# Patient Record
Sex: Male | Born: 1985 | Race: Black or African American | Hispanic: No | Marital: Single | Smoking: Never smoker
Health system: Southern US, Community
[De-identification: ages and names within clinical notes are randomized; demographics above are authoritative.]

---
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
## 2007-10-15 ENCOUNTER — Emergency Department: Payer: Self-pay | Admitting: Emergency Medicine

## 2007-12-15 ENCOUNTER — Emergency Department: Payer: Self-pay | Admitting: Emergency Medicine

## 2008-04-27 ENCOUNTER — Emergency Department: Payer: Self-pay | Admitting: Emergency Medicine

## 2009-05-11 ENCOUNTER — Emergency Department: Payer: Self-pay | Admitting: Emergency Medicine

## 2015-04-23 ENCOUNTER — Encounter: Payer: Self-pay | Admitting: Emergency Medicine

## 2015-04-23 ENCOUNTER — Emergency Department
Admission: EM | Admit: 2015-04-23 | Discharge: 2015-04-23 | Disposition: A | Discharge status: Home / Self Care | Payer: Medicaid Other | Attending: Emergency Medicine | Admitting: Emergency Medicine

## 2015-04-23 DIAGNOSIS — R197 Diarrhea, unspecified: Secondary | ICD-10-CM | POA: Insufficient documentation

## 2015-04-23 DIAGNOSIS — R112 Nausea with vomiting, unspecified: Secondary | ICD-10-CM | POA: Insufficient documentation

## 2015-04-23 DIAGNOSIS — R1110 Vomiting, unspecified: Secondary | ICD-10-CM | POA: Diagnosis present

## 2015-04-23 LAB — URINALYSIS COMPLETE WITH MICROSCOPIC (ARMC ONLY)
BILIRUBIN URINE: NEGATIVE
Bacteria, UA: NONE SEEN
GLUCOSE, UA: NEGATIVE mg/dL
HGB URINE DIPSTICK: NEGATIVE
Ketones, ur: NEGATIVE mg/dL
Leukocytes, UA: NEGATIVE
Nitrite: NEGATIVE
PH: 5 (ref 5.0–8.0)
Protein, ur: 30 mg/dL — AB
Specific Gravity, Urine: 1.028 (ref 1.005–1.030)

## 2015-04-23 LAB — COMPREHENSIVE METABOLIC PANEL
ALT: 58 U/L (ref 17–63)
ANION GAP: 8 (ref 5–15)
AST: 40 U/L (ref 15–41)
Albumin: 5.3 g/dL — ABNORMAL HIGH (ref 3.5–5.0)
Alkaline Phosphatase: 44 U/L (ref 38–126)
BUN: 21 mg/dL — ABNORMAL HIGH (ref 6–20)
CALCIUM: 10 mg/dL (ref 8.9–10.3)
CHLORIDE: 105 mmol/L (ref 101–111)
CO2: 25 mmol/L (ref 22–32)
Creatinine, Ser: 1.21 mg/dL (ref 0.61–1.24)
GFR calc non Af Amer: >60 mL/min (ref >60)
Glucose, Bld: 105 mg/dL — ABNORMAL HIGH (ref 65–99)
Potassium: 4.2 mmol/L (ref 3.5–5.1)
SODIUM: 138 mmol/L (ref 135–145)
Total Bilirubin: 1 mg/dL (ref 0.3–1.2)
Total Protein: 9.3 g/dL — ABNORMAL HIGH (ref 6.5–8.1)

## 2015-04-23 LAB — CBC
HCT: 44.2 % (ref 40.0–52.0)
Hemoglobin: 15.2 g/dL (ref 13.0–18.0)
MCH: 28.7 pg (ref 26.0–34.0)
MCHC: 34.4 g/dL (ref 32.0–36.0)
MCV: 83.3 fL (ref 80.0–100.0)
PLATELETS: 281 10*3/uL (ref 150–440)
RBC: 5.31 MIL/uL (ref 4.40–5.90)
RDW: 14.7 % — ABNORMAL HIGH (ref 11.5–14.5)
WBC: 10.4 10*3/uL (ref 3.8–10.6)

## 2015-04-23 LAB — LIPASE, BLOOD: LIPASE: 23 U/L (ref 11–51)

## 2015-04-23 MED ORDER — EPINEPHRINE 0.3 MG/0.3ML IJ SOAJ: 0.3000 mg | Freq: Once | INTRAMUSCULAR | Status: AC

## 2015-04-23 MED ORDER — ONDANSETRON HCL 4 MG PO TABS: 4.0000 mg | ORAL_TABLET | Freq: Three times a day (TID) | ORAL | Status: AC | PRN

## 2015-04-23 NOTE — Discharge Instructions (Signed)
Please seek medical attention for any high fevers, chest pain, shortness of breath, change in behavior, persistent vomiting, bloody stool or any other new or concerning symptoms. ° ° °Food Choices to Help Relieve Diarrhea, Adult °When you have diarrhea, the foods you eat and your eating habits are very important. Choosing the right foods and drinks can help relieve diarrhea. Also, because diarrhea can last up to 7 days, you need to replace lost fluids and electrolytes (such as sodium, potassium, and chloride) in order to help prevent dehydration.  °WHAT GENERAL GUIDELINES DO I NEED TO FOLLOW? °· Slowly drink 1 cup (8 oz) of fluid for each episode of diarrhea. If you are getting enough fluid, your urine will be clear or pale yellow. °· Eat starchy foods. Some good choices include white rice, white toast, pasta, low-fiber cereal, baked potatoes (without the skin), saltine crackers, and bagels. °· Avoid large servings of any cooked vegetables. °· Limit fruit to two servings per day. A serving is ½ cup or 1 small piece. °· Choose foods with less than 2 g of fiber per serving. °· Limit fats to less than 8 tsp (38 g) per day. °· Avoid fried foods. °· Eat foods that have probiotics in them. Probiotics can be found in certain dairy products. °· Avoid foods and beverages that may increase the speed at which food moves through the stomach and intestines (gastrointestinal tract). Things to avoid include: °¨ High-fiber foods, such as dried fruit, raw fruits and vegetables, nuts, seeds, and whole grain foods. °¨ Spicy foods and high-fat foods. °¨ Foods and beverages sweetened with high-fructose corn syrup, honey, or sugar alcohols such as xylitol, sorbitol, and mannitol. °WHAT FOODS ARE RECOMMENDED? °Grains °White rice. White, French, or pita breads (fresh or toasted), including plain rolls, buns, or bagels. White pasta. Saltine, soda, or graham crackers. Pretzels. Low-fiber cereal. Cooked cereals made with water (such as  cornmeal, farina, or cream cereals). Plain muffins. Matzo. Melba toast. Zwieback.  °Vegetables °Potatoes (without the skin). Strained tomato and vegetable juices. Most well-cooked and canned vegetables without seeds. Tender lettuce. °Fruits °Cooked or canned applesauce, apricots, cherries, fruit cocktail, grapefruit, peaches, pears, or plums. Fresh bananas, apples without skin, cherries, grapes, cantaloupe, grapefruit, peaches, oranges, or plums.  °Meat and Other Protein Products °Baked or boiled chicken. Eggs. Tofu. Fish. Seafood. Smooth peanut butter. Ground or well-cooked tender beef, ham, veal, lamb, pork, or poultry.  °Dairy °Plain yogurt, kefir, and unsweetened liquid yogurt. Lactose-free milk, buttermilk, or soy milk. Plain hard cheese. °Beverages °Sport drinks. Clear broths. Diluted fruit juices (except prune). Regular, caffeine-free sodas such as ginger ale. Water. Decaffeinated teas. Oral rehydration solutions. Sugar-free beverages not sweetened with sugar alcohols. °Other °Bouillon, broth, or soups made from recommended foods.  °The items listed above may not be a complete list of recommended foods or beverages. Contact your dietitian for more options. °WHAT FOODS ARE NOT RECOMMENDED? °Grains °Whole grain, whole wheat, bran, or rye breads, rolls, pastas, crackers, and cereals. Wild or brown rice. Cereals that contain more than 2 g of fiber per serving. Corn tortillas or taco shells. Cooked or dry oatmeal. Granola. Popcorn. °Vegetables °Raw vegetables. Cabbage, broccoli, Brussels sprouts, artichokes, baked beans, beet greens, corn, kale, legumes, peas, sweet potatoes, and yams. Potato skins. Cooked spinach and cabbage. °Fruits °Dried fruit, including raisins and dates. Raw fruits. Stewed or dried prunes. Fresh apples with skin, apricots, mangoes, pears, raspberries, and strawberries.  °Meat and Other Protein Products °Chunky peanut butter. Nuts and seeds. Beans and lentils.   Bacon.  °Dairy °High-fat  cheeses. Milk, chocolate milk, and beverages made with milk, such as milk shakes. Cream. Ice cream. °Sweets and Desserts °Sweet rolls, doughnuts, and sweet breads. Pancakes and waffles. °Fats and Oils °Butter. Cream sauces. Margarine. Salad oils. Plain salad dressings. Olives. Avocados.  °Beverages °Caffeinated beverages (such as coffee, tea, soda, or energy drinks). Alcoholic beverages. Fruit juices with pulp. Prune juice. Soft drinks sweetened with high-fructose corn syrup or sugar alcohols. °Other °Coconut. Hot sauce. Chili powder. Mayonnaise. Gravy. Cream-based or milk-based soups.  °The items listed above may not be a complete list of foods and beverages to avoid. Contact your dietitian for more information. °WHAT SHOULD I DO IF I BECOME DEHYDRATED? °Diarrhea can sometimes lead to dehydration. Signs of dehydration include dark urine and dry mouth and skin. If you think you are dehydrated, you should rehydrate with an oral rehydration solution. These solutions can be purchased at pharmacies, retail stores, or online.  °Drink ½-1 cup (120-240 mL) of oral rehydration solution each time you have an episode of diarrhea. If drinking this amount makes your diarrhea worse, try drinking smaller amounts more often. For example, drink 1-3 tsp (5-15 mL) every 5-10 minutes.  °A general rule for staying hydrated is to drink 1½-2 L of fluid per day. Talk to your health care provider about the specific amount you should be drinking each day. Drink enough fluids to keep your urine clear or pale yellow. °  °This information is not intended to replace advice given to you by your health care provider. Make sure you discuss any questions you have with your health care provider. °  °Document Released: 04/16/2003 Document Revised: 02/14/2014 Document Reviewed: 12/17/2012 °Elsevier Interactive Patient Education ©2016 [employer] ° °

## 2015-04-23 NOTE — ED Notes (Signed)
Pt informed to return if any life threatening symptoms occur.  

## 2015-04-23 NOTE — ED Provider Notes (Signed)
[facility] Emergency Department Provider Note    ____________________________________________  Time seen: ~1420  I have reviewed the triage vital signs and the nursing notes.   HISTORY  Chief Complaint Emesis and Diarrhea   History limited by: Not Limited   HPI Dalton Marks is a 30 y.o. male who presents to the emergency department because of concerns for nausea and vomiting and diarrhea. He states the symptoms started roughly 12 hours ago. He has only had one episode of vomiting although states he does not have an appetite and does not feel like he could eat or drink. He has some multiple episodes of watery diarrhea. He denies any blood in the diarrhea hours episode of emesis. He denies any associated abdominal pain. Denies any associated fevers. He does not believe he is been around anybody who has been sick however his companion states that she has also started to throw up. He denies any associated fevers.  In addition the patient asked to get a EpiPen prescription. 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   History reviewed. No pertinent past medical history.  There are no active problems to display for this patient.   History reviewed. No pertinent past surgical history.  No current outpatient prescriptions on file.  Allergies Wasp venom  No family history on file.  Social History Social History  Substance Use Topics  . Smoking status: Never Smoker   . Smokeless tobacco: Never Used  . Alcohol Use: No    Review of Systems  Constitutional: Negative for fever. Cardiovascular: Negative for chest pain. Respiratory: Negative for shortness of breath. Gastrointestinal: Positive for nausea and vomiting. Positive for diarrhea.  Neurological: Negative for headaches, focal weakness or numbness.  10-point ROS otherwise  negative.  ____________________________________________   PHYSICAL EXAM:  VITAL SIGNS: ED Triage Vitals  Enc Vitals Group     BP 04/23/15 1147 126/76 mmHg     Pulse Rate 04/23/15 1147 89     Resp 04/23/15 1147 18     Temp 04/23/15 1154 97.8 F (36.6 C)     Temp Source 04/23/15 1147 Oral     SpO2 04/23/15 1147 98 %     Weight 04/23/15 1147 212 lb (96.163 kg)     Height 04/23/15 1147 5\' 10"  (1.778 m)     Head Cir --      Peak Flow --      Pain Score 04/23/15 1149 0   Constitutional: Alert and oriented. Well appearing and in no distress. Eyes: Conjunctivae are normal. PERRL. Normal extraocular movements. ENT   Head: Normocephalic and atraumatic.   Nose: No congestion/rhinnorhea.   Mouth/Throat: Mucous membranes are moist.   Neck: No stridor. Hematological/Lymphatic/Immunilogical: No cervical lymphadenopathy. Cardiovascular: Normal rate, regular rhythm.  No murmurs, rubs, or gallops. Respiratory: Normal respiratory effort without tachypnea nor retractions. Breath sounds are clear and equal bilaterally. No wheezes/rales/rhonchi. Gastrointestinal: Soft and nontender. No distention.  Genitourinary: Deferred Musculoskeletal: Normal range of motion in all extremities. No joint effusions.  No lower extremity tenderness nor edema. Neurologic:  Normal speech and language. No gross focal neurologic deficits are appreciated.  Skin:  Skin is warm, dry and intact. No rash noted. Psychiatric: Mood and affect are normal. Speech and behavior are normal. Patient exhibits appropriate insight and judgment.  ____________________________________________    LABS (pertinent positives/negatives)  Labs Reviewed  COMPREHENSIVE METABOLIC PANEL - Abnormal; Notable for the following:    Glucose,  Bld 105 (*)    BUN 21 (*)    Total Protein 9.3 (*)    Albumin 5.3 (*)    All other components within normal limits  CBC - Abnormal; Notable for the following:    RDW 14.7 (*)    All other  components within normal limits  URINALYSIS COMPLETEWITH MICROSCOPIC ([facility] ONLY) - Abnormal; Notable for the following:    Color, Urine YELLOW (*)    APPearance CLEAR (*)    Protein, ur 30 (*)    Squamous Epithelial / LPF 0-5 (*)    All other components within normal limits  LIPASE, BLOOD     ____________________________________________   EKG  None  ____________________________________________    RADIOLOGY  None  ____________________________________________   PROCEDURES  Procedure(s) performed: None  Critical Care performed: No  ____________________________________________   INITIAL IMPRESSION / ASSESSMENT AND PLAN / ED COURSE  Pertinent labs & imaging results that were available during my care of the patient were reviewed by me and considered in my medical decision making (see chart for details).  Patient presented to the emergency department because of concerns for an nausea vomiting and diarrhea. Physical exam patient's abdomen is quite benign. He is not tachycardic nor hypotensive. At this point given lack of fever or leukocytosis I doubt a significant intra-abdominal infection. I think more likely patient suffering from gastroenteritis. Will give antiemetics.f  ____________________________________________   FINAL CLINICAL IMPRESSION(S) / ED DIAGNOSES  Final diagnoses:  Nausea and vomiting, vomiting of unspecified type  Diarrhea, unspecified type     Phineas Semen, MD 04/23/15 1431

## 2015-04-23 NOTE — ED Notes (Addendum)
Pt awakened at 3AM with n/v/d. Has vomited once, states 30 x clear diarrhea. Pt weak and tired, c/o headache 3/10. NAD noted.

## 2015-04-23 NOTE — ED Notes (Signed)
C/O vomiting, diarrhea, dizziness.  Onset of symptoms this morning at 0330.

## 2016-01-11 ENCOUNTER — Emergency Department: Payer: Medicaid Other

## 2016-01-11 ENCOUNTER — Emergency Department
Admission: EM | Admit: 2016-01-11 | Discharge: 2016-01-11 | Disposition: A | Discharge status: Home / Self Care | Payer: Medicaid Other | Attending: Emergency Medicine | Admitting: Emergency Medicine

## 2016-01-11 ENCOUNTER — Encounter: Payer: Self-pay | Admitting: Medical Oncology

## 2016-01-11 DIAGNOSIS — Y999 Unspecified external cause status: Secondary | ICD-10-CM | POA: Insufficient documentation

## 2016-01-11 DIAGNOSIS — W108XXA Fall (on) (from) other stairs and steps, initial encounter: Secondary | ICD-10-CM | POA: Diagnosis not present

## 2016-01-11 DIAGNOSIS — Y939 Activity, unspecified: Secondary | ICD-10-CM | POA: Insufficient documentation

## 2016-01-11 DIAGNOSIS — Y929 Unspecified place or not applicable: Secondary | ICD-10-CM | POA: Insufficient documentation

## 2016-01-11 DIAGNOSIS — S61411A Laceration without foreign body of right hand, initial encounter: Secondary | ICD-10-CM

## 2016-01-11 DIAGNOSIS — S60221A Contusion of right hand, initial encounter: Secondary | ICD-10-CM

## 2016-01-11 MED ORDER — LIDOCAINE HCL (PF) 1 % IJ SOLN
5.0000 mL | Freq: Once | INTRAMUSCULAR | Status: AC
  Administered 2016-01-11: 5 mL
  Filled 2016-01-11: qty 5

## 2016-01-11 NOTE — ED Provider Notes (Signed)
[facility] Emergency Department Provider Note ____________________________________________  Time seen: 911938  I have reviewed the triage vital signs and the nursing notes.  HISTORY  Chief Complaint  Laceration  HPI Dalton Marks is a 30 y.o. male who presents to the ED for right hand puncture laceration wound. He is right-hand dominant male who describes falling down the steps today and accidentally hitting an exposed screw anchoring the wall, with his right hand. The injury occurred over the knuckle of his right pinky. He denies any other injury at this time. He describes pain with composite fist and a small laceration over the knuckle. He reports his tetanus status is current as of 2 years prior.  History reviewed. No pertinent past medical history.  There are no active problems to display for this patient.  History reviewed. No pertinent surgical history.  Prior to Admission medications   Medication Sig Start Date End Date Taking? Authorizing Provider  EPINEPHrine (EPIPEN 2-PAK) 0.3 mg/0.3 mL IJ SOAJ injection Inject 0.3 mLs (0.3 mg total) into the muscle once. 04/23/15   Phineas SemenGraydon Goodman, MD  ondansetron (ZOFRAN) 4 MG tablet Take 1 tablet (4 mg total) by mouth every 8 (eight) hours as needed for nausea or vomiting. 04/23/15   Phineas SemenGraydon Goodman, MD    Allergies Wasp venom  No family history on file.  Social History Social History  Substance Use Topics  . Smoking status: Never Smoker  . Smokeless tobacco: Never Used  . Alcohol use No    Review of Systems  Constitutional: Negative for fever. Cardiovascular: Negative for chest pain. Respiratory: Negative for shortness of breath. Musculoskeletal: Negative for back pain. Right hand pain as above. Skin: Negative for rash. Neurological: Negative for headaches, focal weakness or numbness. ____________________________________________  PHYSICAL EXAM:  VITAL SIGNS: ED Triage Vitals [01/11/16 1825]   Enc Vitals Group     BP (!) 147/85     Pulse Rate 67     Resp 18     Temp 98.2 F (36.8 C)     Temp Source Oral     SpO2 98 %     Weight 225 lb (102.1 kg)     Height 5\' 10"  (1.778 m)     Head Circumference      Peak Flow      Pain Score 2     Pain Loc      Pain Edu?      Excl. in GC?    Constitutional: Alert and oriented. Well appearing and in no distress. Head: Normocephalic and atraumatic. Cardiovascular: Normal rate, regular rhythm. Normal distal pulses. Respiratory: Normal respiratory effort. No wheezes/rales/rhonchi. Musculoskeletal: Right hand with subtle swelling over the fifth MCP dorsally. Patient also has a small 0.5; laceration knuckle. He has decreased composite fist secondary to pain and disability. Nontender with normal range of motion in all other  extremities.  Neurologic:  Normal gross sensation. Normal speech and language. No gross focal neurologic deficits are appreciated. Skin:  Skin is warm, dry and intact. No rash noted.  ____________________________________________   RADIOLOGY  Right Hand IMPRESSION: No acute abnormality.  I, Marleigh Kaylor, Charlesetta IvoryJenise V Bacon, personally viewed and evaluated these images (plain radiographs) as part of my medical decision making, as well as reviewing the written report by the radiologist. ____________________________________________  PROCEDURES  LACERATION REPAIR Performed by: Lissa HoardMenshew, Sonnia Strong V Bacon Authorized by: Lissa HoardMenshew, Anand Tejada V Bacon Consent: Verbal consent obtained. Risks and benefits: risks, benefits and alternatives were discussed Consent given by: patient Patient identity  confirmed: provided demographic data Prepped and Draped in normal sterile fashion Wound explored  Laceration Location: right dorsal 5th MCP  Laceration Length: 0.5 cm  No Foreign Bodies seen or palpated  Anesthesia: local infiltration  Local anesthetic: lidocaine 1% w/o epinephrine  Anesthetic total: 2 ml  Irrigation method:  syringe Amount of cleaning: standard  Skin closure: 4-0 nylon  Number of sutures: 2   Technique: interrupted  Patient tolerance: Patient tolerated the procedure well with no immediate complications. ____________________________________________  INITIAL IMPRESSION / ASSESSMENT AND PLAN / ED COURSE  Patient with a right hand contusion and small laceration over the fifth MCP status post suture repair. Patient is discharged with instructions to keep the wound clean, covered, and dry. He should apply ice for the swelling of the knuckle. [facility] 10 [facility] for suture removal and may see Dr. Martha ClanKrasinski as needed for ongoing joint pain.  Clinical Course    ____________________________________________  FINAL CLINICAL IMPRESSION(S) / ED DIAGNOSES  Final diagnoses:  Laceration of right hand without foreign body, initial encounter  Contusion of right hand, initial encounter      Lissa HoardJenise V Bacon Kortlynn Poust, PA-C 01/11/16 2041    Phineas SemenGraydon Goodman, MD 01/11/16 2336

## 2016-01-11 NOTE — Discharge Instructions (Signed)
Your x-ray was negative today following your injury. Keep the wound clean, dry, and covered. Wash daily with soap & water. Follow-up with your provider or a local urgent care center for suture removal in 7-10 days. Apply ice to reduce swelling. Follow-up with Dr. Martha ClanKrasinski for continued symptoms.

## 2016-01-11 NOTE — ED Triage Notes (Signed)
Pt reports laceration to rt hand.

## 2017-12-28 ENCOUNTER — Emergency Department: Payer: Self-pay

## 2017-12-28 ENCOUNTER — Emergency Department
Admission: EM | Admit: 2017-12-28 | Discharge: 2017-12-28 | Disposition: A | Discharge status: Home / Self Care | Payer: Self-pay | Attending: Emergency Medicine | Admitting: Emergency Medicine

## 2017-12-28 ENCOUNTER — Encounter: Payer: Self-pay | Admitting: Emergency Medicine

## 2017-12-28 DIAGNOSIS — Y939 Activity, unspecified: Secondary | ICD-10-CM | POA: Insufficient documentation

## 2017-12-28 DIAGNOSIS — Y999 Unspecified external cause status: Secondary | ICD-10-CM | POA: Insufficient documentation

## 2017-12-28 DIAGNOSIS — Y929 Unspecified place or not applicable: Secondary | ICD-10-CM | POA: Insufficient documentation

## 2017-12-28 DIAGNOSIS — X509XXA Other and unspecified overexertion or strenuous movements or postures, initial encounter: Secondary | ICD-10-CM | POA: Insufficient documentation

## 2017-12-28 DIAGNOSIS — S63637A Sprain of interphalangeal joint of left little finger, initial encounter: Secondary | ICD-10-CM | POA: Insufficient documentation

## 2017-12-28 MED ORDER — IBUPROFEN 800 MG PO TABS: 800.0000 mg | ORAL_TABLET | Freq: Three times a day (TID) | ORAL | 0 refills | Status: AC | PRN

## 2017-12-28 NOTE — ED Provider Notes (Signed)
[facility] Emergency Department Provider Note   ____________________________________________   First MD Initiated Contact with Patient 12/28/17 [phone]     (approximate)  I have reviewed the triage vital signs and the nursing notes.   HISTORY  Chief Complaint Hand Pain    HPI Dalton Marks is a 32 y.o. male patient still working yesterday a.m. with left hand edema and swollen concentrated mostly in the fifth digit.  Patient decreased range of motion for extension.  Patient denies provoking incident for complaint.  Patient rates pain a 6/10.  Patient described the pain as "aching".  No palliative measures for complaint.  History reviewed. No pertinent past medical history.  There are no active problems to display for this patient.   History reviewed. No pertinent surgical history.  Prior to Admission medications   Medication Sig Start Date End Date Taking? Authorizing Provider  EPINEPHrine (EPIPEN 2-PAK) 0.3 mg/0.3 mL IJ SOAJ injection Inject 0.3 mLs (0.3 mg total) into the muscle once. 04/23/15   Phineas Semen, MD  ibuprofen (ADVIL,MOTRIN) 800 MG tablet Take 1 tablet (800 mg total) by mouth every 8 (eight) hours as needed for moderate pain. 12/28/17   Joni Reining, PA-C  ondansetron (ZOFRAN) 4 MG tablet Take 1 tablet (4 mg total) by mouth every 8 (eight) hours as needed for nausea or vomiting. 04/23/15   Phineas Semen, MD    Allergies Oxycodone and Wasp venom  No family history on file.  Social History Social History   Tobacco Use  . Smoking status: Never Smoker  . Smokeless tobacco: Never Used  Substance Use Topics  . Alcohol use: No  . Drug use: No    Review of Systems Constitutional: No fever/chills Eyes: No visual changes. ENT: No sore throat. Cardiovascular: Denies chest pain. Respiratory: Denies shortness of breath. Gastrointestinal: No abdominal pain.  No nausea, no vomiting.  No diarrhea.  No  constipation. Genitourinary: Negative for dysuria. Musculoskeletal: Left fifth finger pain. Skin: Negative for rash. Neurological: Negative for headaches, focal weakness or numbness. Allergic/Immunilogical: Oxycodone and bee stings. ____________________________________________   PHYSICAL EXAM:  VITAL SIGNS: ED Triage Vitals [12/28/17 0852]  Enc Vitals Group     BP (!) 144/76     Pulse Rate 72     Resp 20     Temp 98.1 F (36.7 C)     Temp Source Oral     SpO2 97 %     Weight 220 lb (99.8 kg)     Height 5\' 10"  (1.778 m)     Head Circumference      Peak Flow      Pain Score 6     Pain Loc      Pain Edu?      Excl. in GC?     Constitutional: Alert and oriented. Well appearing and in no acute distress. Cardiovascular: Normal rate, regular rhythm. Grossly normal heart sounds.  Good peripheral circulation. Respiratory: Normal respiratory effort.  No retractions. Lungs CTAB. Musculoskeletal: No obvious deformity although the patient keeps the finger in a flexed position.  Mild edema but no erythema. Neurologic:  Normal speech and language. No gross focal neurologic deficits are appreciated. No gait instability. Skin:  Skin is warm, dry and intact. No rash noted. Psychiatric: Mood and affect are normal. Speech and behavior are normal.  ____________________________________________   LABS (all labs ordered are listed, but only abnormal results are displayed)  Labs Reviewed - No data to display ____________________________________________  EKG  ____________________________________________  RADIOLOGY  ED MD interpretation:    Official radiology report(s): Dg Finger Little Left  Result Date: 12/28/2017 CLINICAL DATA:  Pain, edema, decreased range of motion, swelling EXAM: LEFT LITTLE FINGER 2+V COMPARISON:  None. FINDINGS: No acute bony abnormality. Specifically, no fracture, subluxation, or dislocation. Radiopaque densities are noted in the posterior soft tissues on  the lateral view. IMPRESSION: No bony abnormality. Radiopaque foreign bodies in the posterior soft tissues on the lateral view of unknown chronicity. Electronically Signed   By: Charlett NoseKevin  Dover M.D.   On: 12/28/2017 09:27    ____________________________________________   PROCEDURES  Procedure(s) performed: None  .Splint Application Date/Time: 12/28/2017 9:43 AM Performed by: Ricke HeyHooker, [place], NT Authorized by: Joni ReiningSmith, Zaelynn Fuchs K, PA-C   Consent:    Consent obtained:  Verbal   Consent given by:  Patient   Risks discussed:  Pain and swelling Pre-procedure details:    Sensation:  Normal Procedure details:    Laterality:  Left   Location:  Finger   Finger:  L small finger   Cast type:  Finger   Supplies:  Aluminum splint    Critical Care performed: No  ____________________________________________   INITIAL IMPRESSION / ASSESSMENT AND PLAN / ED COURSE  As part of my medical decision making, I reviewed the following data within the electronic MEDICAL RECORD NUMBER    Patient presents with left fifth finger pain, edema, and decreased range of motion with extension.  Patient denies provocative incident for complaint.  Discussed  x-ray findings with patient.  Patient finger was splinted and is given discharge care instruction for sprain.  Patient [facility]  Patient given anti-inflammatory medications.      ____________________________________________   FINAL CLINICAL IMPRESSION(S) / ED DIAGNOSES  Final diagnoses:  Sprain of interphalangeal joint of left little finger, initial encounter     ED Discharge Orders         Ordered    ibuprofen (ADVIL,MOTRIN) 800 MG tablet  Every 8 hours PRN     12/28/17 0950           Note:  This document was prepared using Dragon voice recognition software and may include unintentional dictation errors.    Joni ReiningSmith, Narissa Beaufort K, PA-C 12/28/17 [id number]    Governor RooksLord, Rebecca, MD 12/28/17 [phone]

## 2017-12-28 NOTE — ED Notes (Signed)
Patient states, "I think my pinky is broken again."  Patient states he does not have any memory of any trauma to hand.  Patient states, "sometimes, I pop my knuckles and I did that last night."  Patient is in no obvious distress at this time.  Patient states he broke his pinky in the past playing football.  Patient is in no obvious distress at this time.

## 2017-12-28 NOTE — ED Triage Notes (Signed)
Pt reports awoke yesterday am and his left hand was swollen and hurting. Pt denies recent injuries, states swelling has went down.

## 2017-12-28 NOTE — Discharge Instructions (Signed)
Wear splint for 2 to 3 days while awake.

## 2018-04-24 IMAGING — DX DG HAND COMPLETE 3+V*R*
3 series · 3 of 3 positions shown · non-contrast
Comparison: Plain films of the right hand 09/27/2015.

CLINICAL DATA: Right hand pain since a fall going down stairs
today. Initial encounter.

EXAM:
RIGHT HAND - COMPLETE 3+ VIEW

[hand ap]
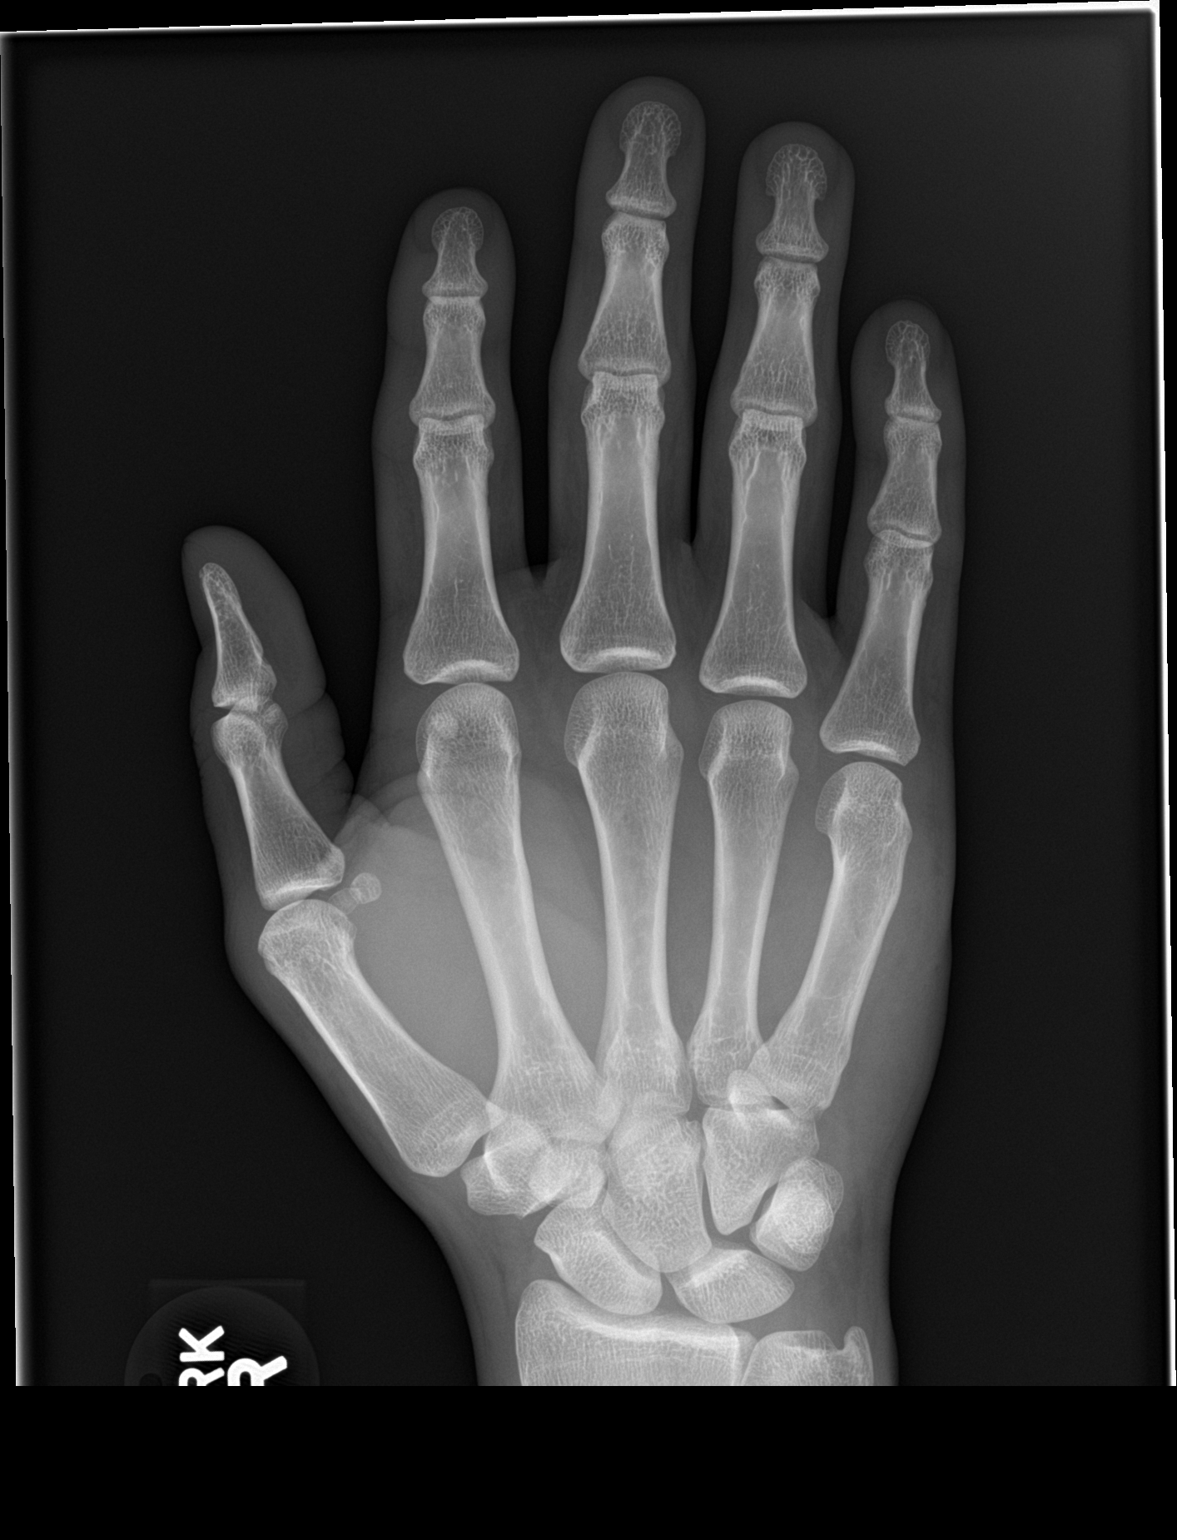

[hand obl]
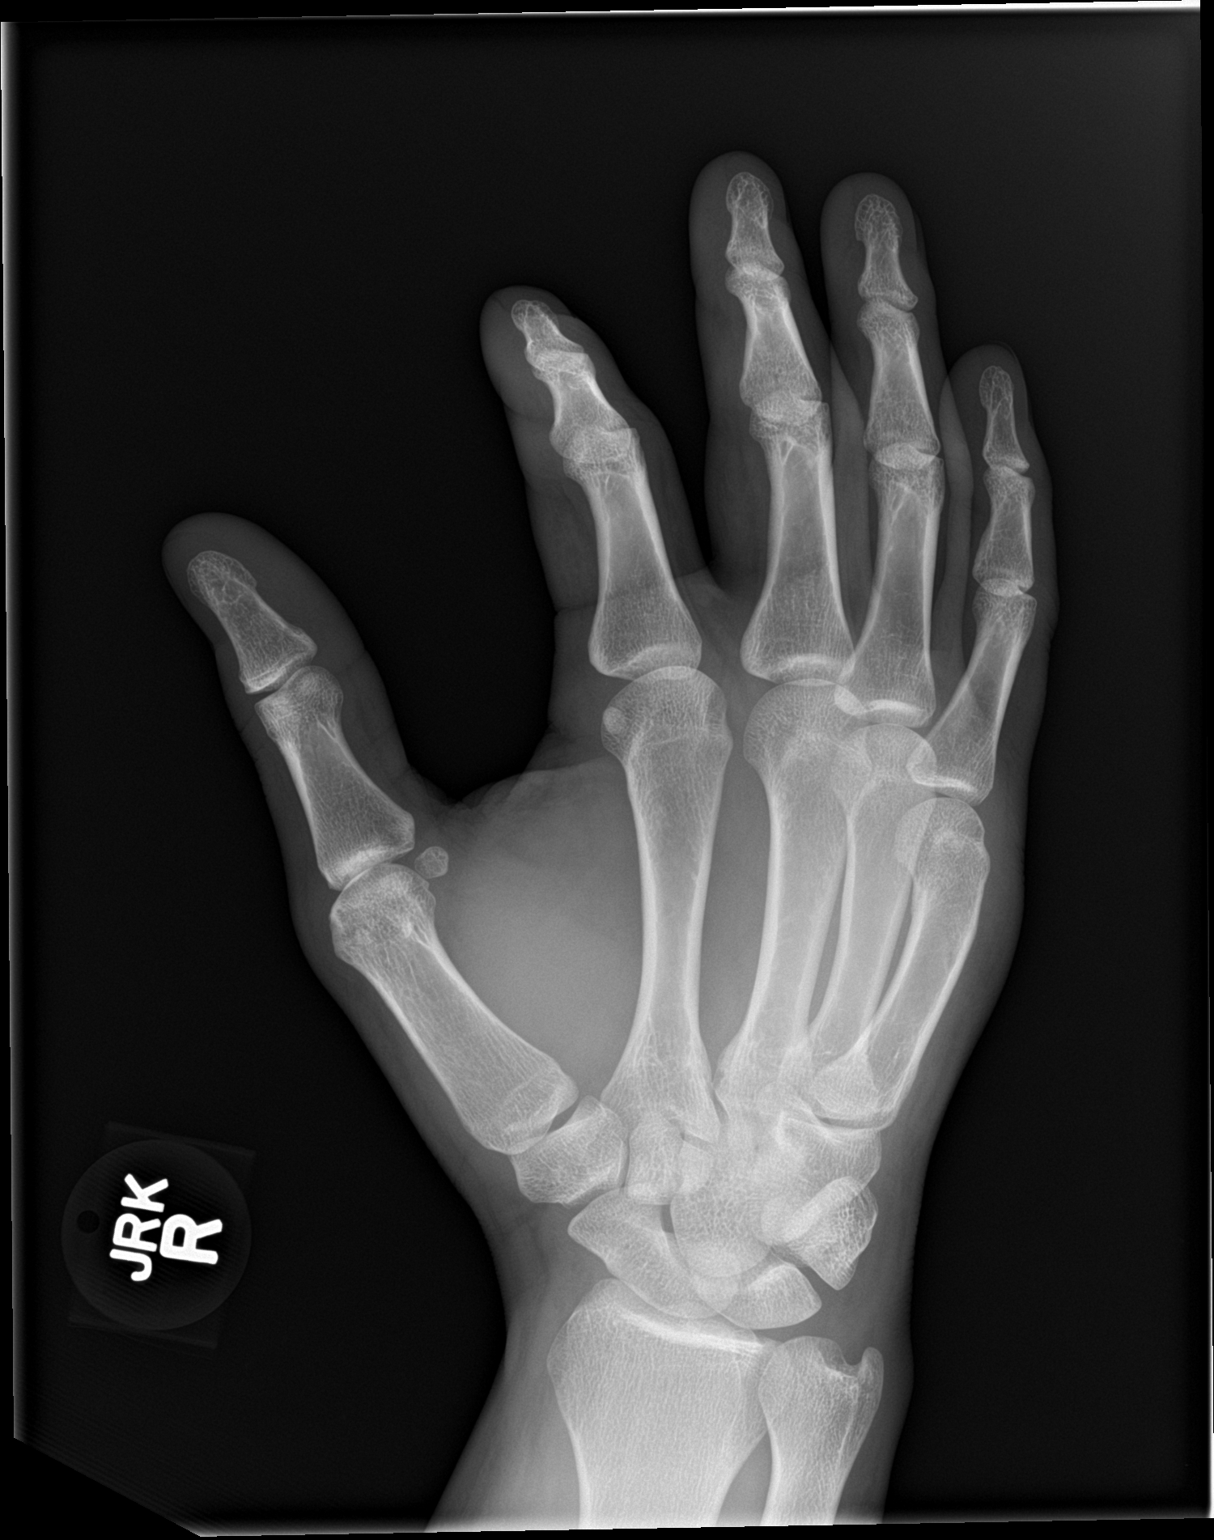

[hand lat]
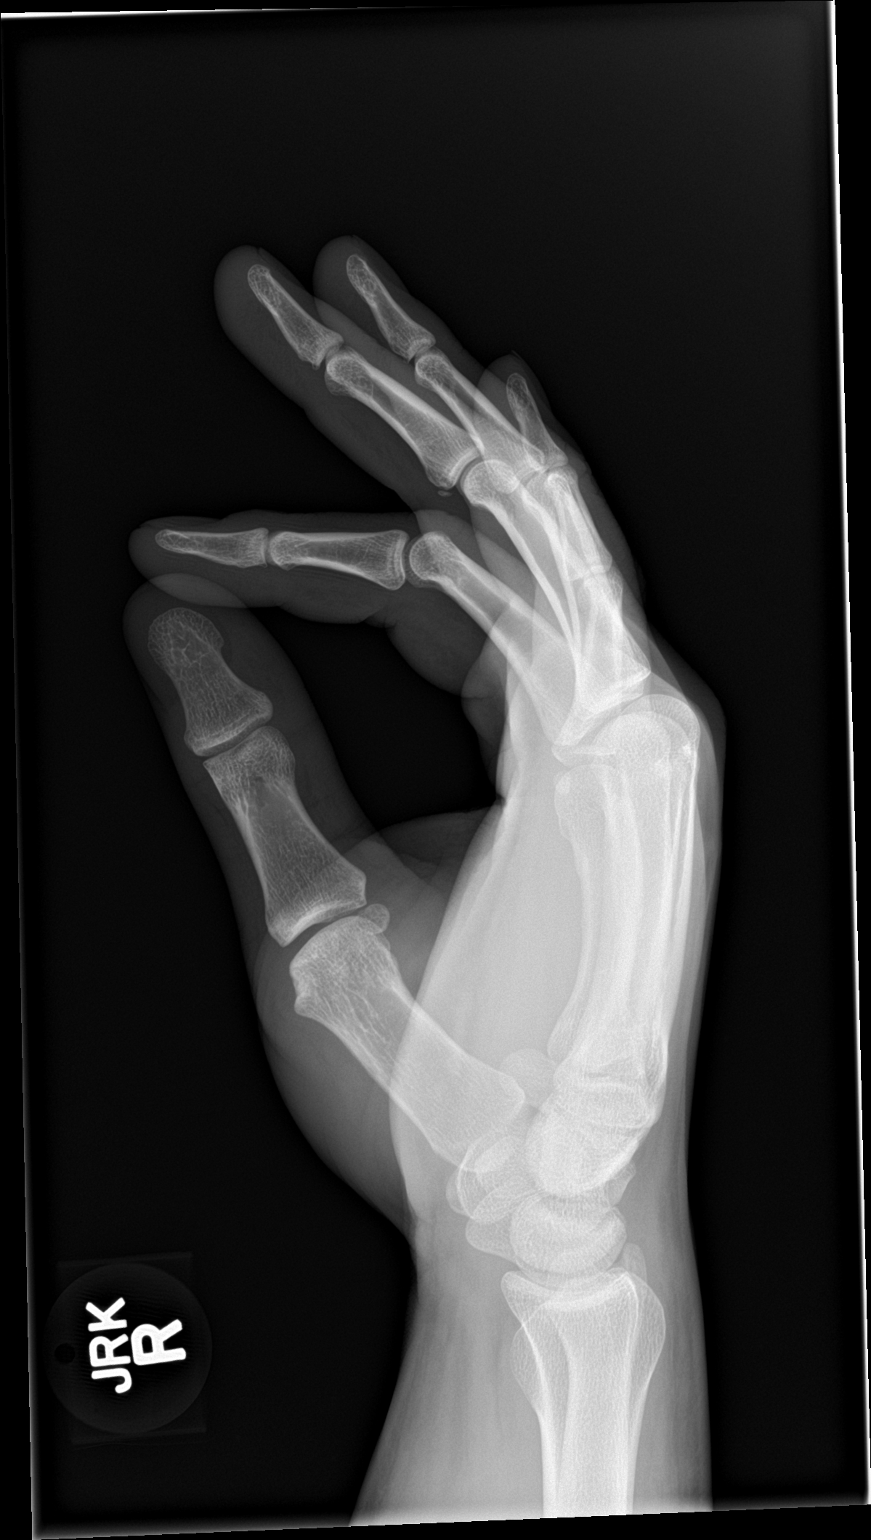

[3 of 3 positions shown; findings below may reference images not displayed]

FINDINGS: No acute bony or joint abnormality is identified. No radiopaque
foreign body is seen. Healed fifth metacarpal fracture is noted.
IMPRESSION: No acute abnormality.
